# Patient Record
Sex: Male | Born: 1985 | Race: White | Hispanic: No | Marital: Married | Smoking: Former smoker
Health system: Southern US, Community
[De-identification: ages and names within clinical notes are randomized; demographics above are authoritative.]

---

## 2012-10-18 ENCOUNTER — Ambulatory Visit (INDEPENDENT_AMBULATORY_CARE_PROVIDER_SITE_OTHER): Payer: BC Managed Care – PPO | Admitting: Sports Medicine

## 2012-10-18 ENCOUNTER — Encounter: Payer: Self-pay | Admitting: *Deleted

## 2012-10-18 ENCOUNTER — Emergency Department (INDEPENDENT_AMBULATORY_CARE_PROVIDER_SITE_OTHER): Payer: BC Managed Care – PPO

## 2012-10-18 ENCOUNTER — Emergency Department
Admission: EM | Admit: 2012-10-18 | Discharge: 2012-10-18 | Disposition: A | Discharge status: Home / Self Care | Payer: BC Managed Care – PPO | Source: Home / Self Care | Attending: Family Medicine | Admitting: Family Medicine

## 2012-10-18 DIAGNOSIS — M7061 Trochanteric bursitis, right hip: Secondary | ICD-10-CM | POA: Insufficient documentation

## 2012-10-18 DIAGNOSIS — M25559 Pain in unspecified hip: Secondary | ICD-10-CM

## 2012-10-18 DIAGNOSIS — M7062 Trochanteric bursitis, left hip: Secondary | ICD-10-CM

## 2012-10-18 DIAGNOSIS — M76899 Other specified enthesopathies of unspecified lower limb, excluding foot: Secondary | ICD-10-CM

## 2012-10-18 MED ORDER — MELOXICAM 15 MG PO TABS: ORAL_TABLET | ORAL | Status: DC

## 2012-10-18 NOTE — ED Provider Notes (Signed)
History     CSN: [id number]  Arrival date & time 10/18/12  1154   First MD Initiated Contact with Patient 10/18/12 1221      Chief Complaint  Patient presents with  . Hip Pain       HPI Comments: Patient complains of onset of pain in his left hip today.  The pain is sharp and does not radiate.  He recalls no injury or recent change in activities.  Patient is a 27 y.o. male presenting with hip pain. The history is provided by the patient.  Hip Pain This is a new problem. Episode onset: today. The problem occurs constantly. The problem has not changed since onset.Associated symptoms comments: none. The symptoms are aggravated by walking. Nothing relieves the symptoms. He has tried nothing for the symptoms.    History reviewed. No pertinent past medical history.  History reviewed. No pertinent past surgical history.  History reviewed. No pertinent family history.  History  Substance Use Topics  . Smoking status: Current Every Day Smoker -- 0.50 packs/day    Types: Cigarettes  . Smokeless tobacco: Not on file  . Alcohol Use: Yes      Review of Systems  All other systems reviewed and are negative.    Allergies  Review of patient's allergies indicates no known allergies.  Home Medications   Current Outpatient Rx  Name  Route  Sig  Dispense  Refill  . meloxicam (MOBIC) 15 MG tablet      One tab PO qAM with breakfast for 2 weeks, then daily prn pain.   30 tablet   3     BP 113/78  Pulse 88  Temp(Src) 97.8 F (36.6 C) (Oral)  Resp 18  Ht 6\' 1"  (1.854 m)  Wt 203 lb (92.08 kg)  BMI 26.79 kg/m2  SpO2 98%  Physical Exam Nursing notes and Vital Signs reviewed. Appearance:  Patient appears healthy, stated age, and in no acute distress Eyes:  Pupils are equal, round, and reactive to light and accomodation.  Extraocular movement is intact.  Conjunctivae are not inflamed  Pharynx:  Normal Neck:  Supple.   No adenopathy Lungs:  Clear to auscultation.  Breath  sounds are equal.  Heart:  Regular rate and rhythm without murmurs, rubs, or gallops.  Abdomen:  Nontender without masses or hepatosplenomegaly.  Bowel sounds are present.  No CVA or flank tenderness.  Extremities:  No edema.  Left hip reveals distinct tenderness over the greater trochanter.  Palpating the greater trochanter during resisted lateral abduction of the hip recreates his pain.  Skin:  No rash present.  Back:  Full range of motion.  No tenderness.  Straight leg raising test is negative.  Sitting knee extension test is negative.  Strength and sensation in the lower extremities is normal.  Patellar and achilles reflexes are normal   ED Course  Procedures  none  Labs Reviewed    DG Hip Complete Left (Final result)  Result time: 10/18/12 12:59:56    Final result by Rad Results In Interface (10/18/12 12:59:56)    Narrative:   *RADIOLOGY REPORT*  Clinical Data: Recent hip pain without injury  LEFT HIP - COMPLETE 2+ VIEW  Comparison: None.  Findings: No acute fracture or dislocation is identified. No soft tissue abnormality is seen. The pelvic ring is intact.  IMPRESSION: No acute abnormality noted.   Original Report Authenticated By: Alcide Clever, M.D.       1. Trochanteric bursitis, left  MDM  Will arrange consultation with Dr. Rodney Langton for management and followup        Lattie Haw, MD 10/23/12 [phone]

## 2012-10-18 NOTE — Patient Instructions (Addendum)
Hip Rehabilitation Protocol:  1.  Side leg raises.  3x30 with no weight, then 3x15 with 2 lb ankle weight, then 3x15 with 5 lb ankle weight 2.  Standing hip rotation.  3x30 with no weight, then 3x15 with 2 lb ankle weight, then 3x15 with 5 lb ankle weight. 3.  Side step ups.  3x30 with no weight, then 3x15 with 5 lbs in backpack, then 3x15 with 10 lbs in backpack. 

## 2012-10-18 NOTE — ED Notes (Signed)
Pt c/o LT hip pain x 1 day. Denies injury. No OTC meds.

## 2012-10-18 NOTE — Assessment & Plan Note (Signed)
Mild on the right, moderate on the left. Severely weak hip abductors bilaterally. Mobic, home exercises. Return in 4 weeks, if no better we can consider injection.

## 2012-10-18 NOTE — Progress Notes (Signed)
   Subjective:    I'm seeing this patient as a consultation for:  Dr. Cathren Harsh  CC: Left hip pain  HPI: This is a very pleasant 27 year old male who comes in with a long history of right hip pain, and a several day history of pain he localizes over the greater trochanter of his left hip. He denies any changes in physical activity or trauma, but noticed pain that he localizes over the trochanter that wakes him up at night and makes it difficult for him to sleep on the lateral side. Pain does not radiate, moderate to severe. He has not tried any medication yet for this. He denies any pain running further down his leg, bowel or bladder changes, or pain in his groin.  Past medical history, Surgical history, Family history not pertinant except as noted below, Social history, Allergies, and medications have been entered into the medical record, reviewed, and no changes needed.   Review of Systems: No headache, visual changes, nausea, vomiting, diarrhea, constipation, dizziness, abdominal pain, skin rash, fevers, chills, night sweats, weight loss, swollen lymph nodes, body aches, joint swelling, muscle aches, chest pain, shortness of breath, mood changes, visual or auditory hallucinations.   Objective:   General: Well Developed, well nourished, and in no acute distress.  Neuro/Psych: Alert and oriented x3, extra-ocular muscles intact, able to move all 4 extremities, sensation grossly intact. Skin: Warm and dry, no rashes noted.  Respiratory: Not using accessory muscles, speaking in full sentences, trachea midline.  Cardiovascular: Pulses palpable, no extremity edema. Abdomen: Does not appear distended. Right Hip: ROM IR: 45 Deg, ER: 45 Deg, Flexion: 120 Deg, Extension: 100 Deg, Abduction: 45 Deg, Adduction: 45 Deg Strength IR: 5/5, ER: 5/5, Flexion: 5/5, Extension: 5/5, Abduction: 5/5, Adduction: 5/5 Pelvic alignment unremarkable to inspection and palpation. Tenderness to palpation of the greater  trochanter. No tenderness over piriformis and greater trochanter. No pain with FABER or FADIR. No SI joint tenderness and normal minimal SI movement.  Left Hip: ROM IR: 45 Deg, ER: 45 Deg, Flexion: 120 Deg, Extension: 100 Deg, Abduction: 45 Deg, Adduction: 45 Deg Strength IR: 5/5, ER: 5/5, Flexion: 5/5, Extension: 5/5, Abduction: 5/5, Adduction: 5/5 Pelvic alignment unremarkable to inspection and palpation. Tenderness to palpation of the greater trochanter. No tenderness over piriformis and greater trochanter. No pain with FABER or FADIR. No SI joint tenderness and normal minimal SI movement.   Hip abductor strength is surprisingly weak bilaterally.  Impression and Recommendations:   This case required medical decision making of moderate complexity.

## 2013-03-24 ENCOUNTER — Emergency Department
Admission: EM | Admit: 2013-03-24 | Discharge: 2013-03-24 | Disposition: A | Discharge status: Home / Self Care | Payer: BC Managed Care – PPO | Source: Home / Self Care | Attending: Emergency Medicine | Admitting: Emergency Medicine

## 2013-03-24 ENCOUNTER — Emergency Department (INDEPENDENT_AMBULATORY_CARE_PROVIDER_SITE_OTHER): Payer: BC Managed Care – PPO

## 2013-03-24 ENCOUNTER — Encounter: Payer: Self-pay | Admitting: Emergency Medicine

## 2013-03-24 DIAGNOSIS — M25449 Effusion, unspecified hand: Secondary | ICD-10-CM

## 2013-03-24 DIAGNOSIS — S60229A Contusion of unspecified hand, initial encounter: Secondary | ICD-10-CM

## 2013-03-24 DIAGNOSIS — S60221A Contusion of right hand, initial encounter: Secondary | ICD-10-CM

## 2013-03-24 MED ORDER — IBUPROFEN 200 MG PO TABS: ORAL_TABLET | ORAL | Status: AC

## 2013-03-24 MED ORDER — HYDROCODONE-ACETAMINOPHEN 5-325 MG PO TABS: 1.0000 | ORAL_TABLET | Freq: Four times a day (QID) | ORAL | Status: DC | PRN

## 2013-03-24 NOTE — ED Provider Notes (Signed)
CSN: [id number]     Arrival date & time 03/24/13  0930 History   First MD Initiated Contact with Patient 03/24/13 1000     Chief Complaint  Patient presents with  . Hand Injury   (Consider location/radiation/quality/duration/timing/severity/associated sxs/prior Treatment) Patient is a 27 y.o. male presenting with hand injury. The history is provided by the patient.  Hand Injury Location:  Hand Time since incident:  1 day Injury: yes (Accidentally closed right hand in his car door)   Mechanism of injury: crush   Crush injury:    Mechanism: Car door. Hand location:  R hand Pain details:    Quality:  Sharp   Radiates to:  R arm   Severity:  Severe   Onset quality:  Sudden   Progression:  Unchanged Handedness:  Right-handed Dislocation: no   Foreign body present:  No foreign bodies Prior injury to area:  No Worsened by:  Movement Ineffective treatments:  NSAIDs Associated symptoms: decreased range of motion, stiffness and swelling   Associated symptoms: no back pain, no fatigue, no fever, no muscle weakness, no neck pain, no numbness and no tingling   Risk factors: no known bone disorder, no frequent fractures and no recent illness     History reviewed. No pertinent past medical history. History reviewed. No pertinent past surgical history. History reviewed. No pertinent family history. History  Substance Use Topics  . Smoking status: Former Smoker -- 0.50 packs/day    Types: Cigarettes  . Smokeless tobacco: Never Used  . Alcohol Use: Yes    Review of Systems  Constitutional: Negative for fever and fatigue.  Musculoskeletal: Positive for joint swelling (Acutely, right-hand) and stiffness. Negative for back pain and neck pain.  All other systems reviewed and are negative.    Allergies  Review of patient's allergies indicates no known allergies.  Home Medications   Current Outpatient Rx  Name  Route  Sig  Dispense  Refill  . HYDROcodone-acetaminophen  (NORCO/VICODIN) 5-325 MG per tablet   Oral   Take 1-2 tablets by mouth every 6 (six) hours as needed for pain. Take with food.   10 tablet   0   . ibuprofen (ADVIL,MOTRIN) 200 MG tablet      Take three tablets ( 600 milligrams total) every 6 with food as needed for pain.   30 tablet   0    BP 117/78  Pulse 72  Resp 14  Ht 6\' 1"  (1.854 m)  Wt 243 lb (110.224 kg)  BMI 32.07 kg/m2  SpO2 100% Physical Exam  Nursing note and vitals reviewed. Constitutional: He is oriented to person, place, and time. He appears well-developed and well-nourished. No distress.  HENT:  Head: Normocephalic and atraumatic.  Eyes: Conjunctivae and EOM are normal. Pupils are equal, round, and reactive to light. No scleral icterus.  Neck: Normal range of motion.  Cardiovascular: Normal rate.   Pulmonary/Chest: Effort normal.  Abdominal: He exhibits no distension.  Neurological: He is alert and oriented to person, place, and time.  Skin: Skin is warm.  Psychiatric: He has a normal mood and affect.    Musculoskeletal: Baseline Strength and Sensation to R hand with normal light touch, intact for Pt, distal motor and sensation in median/radial/ulnar nerve distribution with CR< 2 secs and pulse intact. R  Hand: Very swollen, exquisitely tender, mildly ecchymotic dorsum right hand over second third and fourth metacarpals.   intact motor strength 5/5 flexion / extension / FDP (involved non-thumb digits) / FDS (involved non-thumb digits)  against resistance and 2-point discrimination intact at 5mm in affected digit(s). Skin intact.  Right Wrist: nontender, without deformity. WNL. Normal range of motion. Minimal muscle tenderness right forearm and right upper arm, but otherwise right upper extremity exam normal. No other bony tenderness. Right shoulder exam normal.  ED Course  Procedures (including critical care time) Labs Review Labs Reviewed - No data to display Imaging Review Dg Hand Complete  Right  03/24/2013   CLINICAL DATA:  Pain and swelling post injury 3rd and 4th metacarpal  EXAM: RIGHT HAND - COMPLETE 3+ VIEW  COMPARISON:  None.  FINDINGS: There is no evidence of fracture or dislocation. There is no evidence of arthropathy or other focal bone abnormality. Soft tissue swelling dorsal metacarpal region.  IMPRESSION: No acute fracture or subluxation. Soft tissue swelling metacarpal region.   Electronically Signed   By: Natasha Mead M.D.   On: 03/24/2013 10:17    EKG Interpretation     Ventricular Rate:    PR Interval:    QRS Duration:   QT Interval:    QTC Calculation:   R Axis:     Text Interpretation:              MDM   1. Contusion, hand, right, initial encounter    Reviewed x-ray, within normal limits. Other than soft tissue swelling dorsal metacarpal area. No acute fracture or subluxation. Discussed treatment options, and patient agrees with the following plans: Bulky 6 inch Ace applied right hand in neutral position. Keep elevated. Ice times another 24 hours. Ibuprofen 600 mg 3 times a day p.c. when necessary mild to moderate pain Vicodin. #10. No refills. One or 2Q4 to 6 hours when necessary severe pain. Instruction sheet given regarding hand contusions  Wrote a note, may not work until 03/27/13.--When returns to work on 03/27/13, restrictions of no heavy lifting or gripping with the right hand for 3 days, then otherwise may return to regular work without restrictions on 03/30/13. Explained that if he's not better in one week, followup with PCP or orthopedist. Precautions discussed. Red flags discussed. Questions invited and answered. Patient voiced understanding and agreement.    Lajean Manes, MD 03/24/13 1047

## 2013-03-24 NOTE — ED Notes (Signed)
Lee Schneider closed his right hand in his Lee Schneider door yesterday. Hand is edematous and painful to make a fist. Today pain is traveling up arm.

## 2014-10-26 ENCOUNTER — Encounter: Payer: Self-pay | Admitting: Emergency Medicine

## 2014-10-26 ENCOUNTER — Emergency Department (INDEPENDENT_AMBULATORY_CARE_PROVIDER_SITE_OTHER)
Admission: EM | Admit: 2014-10-26 | Discharge: 2014-10-26 | Disposition: A | Discharge status: Home / Self Care | Payer: BLUE CROSS/BLUE SHIELD | Source: Home / Self Care | Attending: Emergency Medicine | Admitting: Emergency Medicine

## 2014-10-26 ENCOUNTER — Emergency Department (INDEPENDENT_AMBULATORY_CARE_PROVIDER_SITE_OTHER): Payer: BLUE CROSS/BLUE SHIELD

## 2014-10-26 DIAGNOSIS — M25571 Pain in right ankle and joints of right foot: Secondary | ICD-10-CM | POA: Diagnosis not present

## 2014-10-26 DIAGNOSIS — S9031XA Contusion of right foot, initial encounter: Secondary | ICD-10-CM | POA: Diagnosis not present

## 2014-10-26 MED ORDER — HYDROCODONE-ACETAMINOPHEN 5-325 MG PO TABS: 1.0000 | ORAL_TABLET | Freq: Four times a day (QID) | ORAL | Status: AC | PRN

## 2014-10-26 NOTE — Discharge Instructions (Signed)
Contusion °A contusion is a deep bruise. Contusions are the result of an injury that caused bleeding under the skin. The contusion may turn blue, purple, or yellow. Minor injuries will give you a painless contusion, but more severe contusions may stay painful and swollen for a few weeks.  °CAUSES  °A contusion is usually caused by a blow, trauma, or direct force to an area of the body. °SYMPTOMS  °· Swelling and redness of the injured area. °· Bruising of the injured area. °· Tenderness and soreness of the injured area. °· Pain. °DIAGNOSIS  °The diagnosis can be made by taking a history and physical exam. An X-ray, CT scan, or MRI may be needed to determine if there were any associated injuries, such as fractures. °TREATMENT  °Specific treatment will depend on what area of the body was injured. In general, the best treatment for a contusion is resting, icing, elevating, and applying cold compresses to the injured area. Over-the-counter medicines may also be recommended for pain control. Ask your caregiver what the best treatment is for your contusion. °HOME CARE INSTRUCTIONS  °· Put ice on the injured area. °¨ Put ice in a plastic bag. °¨ Place a towel between your skin and the bag. °¨ Leave the ice on for 15-20 minutes, 3-4 times a day, or as directed by your health care provider. °· Only take over-the-counter or prescription medicines for pain, discomfort, or fever as directed by your caregiver. Your caregiver may recommend avoiding anti-inflammatory medicines (aspirin, ibuprofen, and naproxen) for 48 hours because these medicines may increase bruising. °· Rest the injured area. °· If possible, elevate the injured area to reduce swelling. °SEEK IMMEDIATE MEDICAL CARE IF:  °· You have increased bruising or swelling. °· You have pain that is getting worse. °· Your swelling or pain is not relieved with medicines. °MAKE SURE YOU:  °· Understand these instructions. °· Will watch your condition. °· Will get help right  away if you are not doing well or get worse. °Document Released: 03/11/2005 Document Revised: 06/06/2013 Document Reviewed: 04/06/2011 °[employer]® Patient Information ©2015 [employer]. This information is not intended to replace advice given to you by your health care provider. Make sure you discuss any questions you have with your health care provider. ° °

## 2014-10-26 NOTE — ED Provider Notes (Signed)
CSN: [id number]     Arrival date & time 10/26/14  [id number] History   First MD Initiated Contact with Patient 10/26/14 1913     Chief Complaint  Patient presents with  . Foot Injury   (Consider location/radiation/quality/duration/timing/severity/associated sxs/prior Treatment) Patient is a 29 y.o. male presenting with foot injury. The history is provided by the patient. No language interpreter was used.  Foot Injury Location:  Foot Time since incident:  1 day Injury: yes   Foot location:  R foot Pain details:    Quality:  Aching   Radiates to:  Does not radiate   Severity:  Moderate   Onset quality:  Gradual   Duration:  1 hour   Timing:  Constant   Progression:  Worsening Chronicity:  New Relieved by:  Nothing Worsened by:  Nothing tried Ineffective treatments:  None tried Pt dropped a trailer hitch on his foot.  History reviewed. No pertinent past medical history. History reviewed. No pertinent past surgical history. History reviewed. No pertinent family history. History  Substance Use Topics  . Smoking status: Former Smoker -- 0.50 packs/day    Types: Cigarettes  . Smokeless tobacco: Never Used  . Alcohol Use: Yes    Review of Systems  Musculoskeletal: Positive for myalgias and joint swelling.  All other systems reviewed and are negative.   Allergies  Review of patient's allergies indicates no known allergies.  Home Medications   Prior to Admission medications   Medication Sig Start Date End Date Taking? Authorizing Provider  HYDROcodone-acetaminophen (NORCO/VICODIN) 5-325 MG per tablet Take 1-2 tablets by mouth every 6 (six) hours as needed for pain. Take with food. 03/24/13   Lajean Manesavid Massey, MD  ibuprofen (ADVIL,MOTRIN) 200 MG tablet Take three tablets ( 600 milligrams total) every 6 with food as needed for pain. 03/24/13   Lajean Manesavid Massey, MD   BP 104/64 mmHg  Pulse 79  Temp(Src) 97.7 F (36.5 C) (Oral)  Resp 16  Ht 6\' 1"  (1.854 m)  Wt 218 lb (98.884 kg)  BMI  28.77 kg/m2  SpO2 97% Physical Exam  Constitutional: He is oriented to person, place, and time. He appears well-developed and well-nourished.  Musculoskeletal: He exhibits tenderness.  Swollen bruised right 5th toe, tender mid foot,  From, nv and ns intact  Neurological: He is alert and oriented to person, place, and time. He has normal reflexes.  Psychiatric: He has a normal mood and affect.    ED Course  Procedures (including critical care time) Labs Review Labs Reviewed - No data to display  Imaging Review No results found.   MDM   1. Contusion, foot, right, initial encounter    Ace wrap Post op shoe Ibuprofen otc Follow up with Dr. Karie Schwalbe if pain persist. Pt given rx for hydrocodone 10 tablets  ( pt counseled on abuse potential)   Elson AreasLeslie K Akeila Lana, PA-C 10/26/14 1941  Lonia SkinnerLeslie K KimballSofia, PA-C 10/26/14 1955

## 2014-10-26 NOTE — ED Notes (Signed)
Reports dropping a trailer tongue/hitch on his right foot about one hour ago; very painful and difficult to bear weight; no broken skin.

## 2016-12-22 IMAGING — CR DG FOOT COMPLETE 3+V*R*
3 series · 3 of 3 positions shown · non-contrast
Comparison: None.

CLINICAL DATA: Injury to the right foot today. The patient
complains of pain in the lateral foot.

EXAM:
RIGHT FOOT COMPLETE - 3+ VIEW

[foot ap]
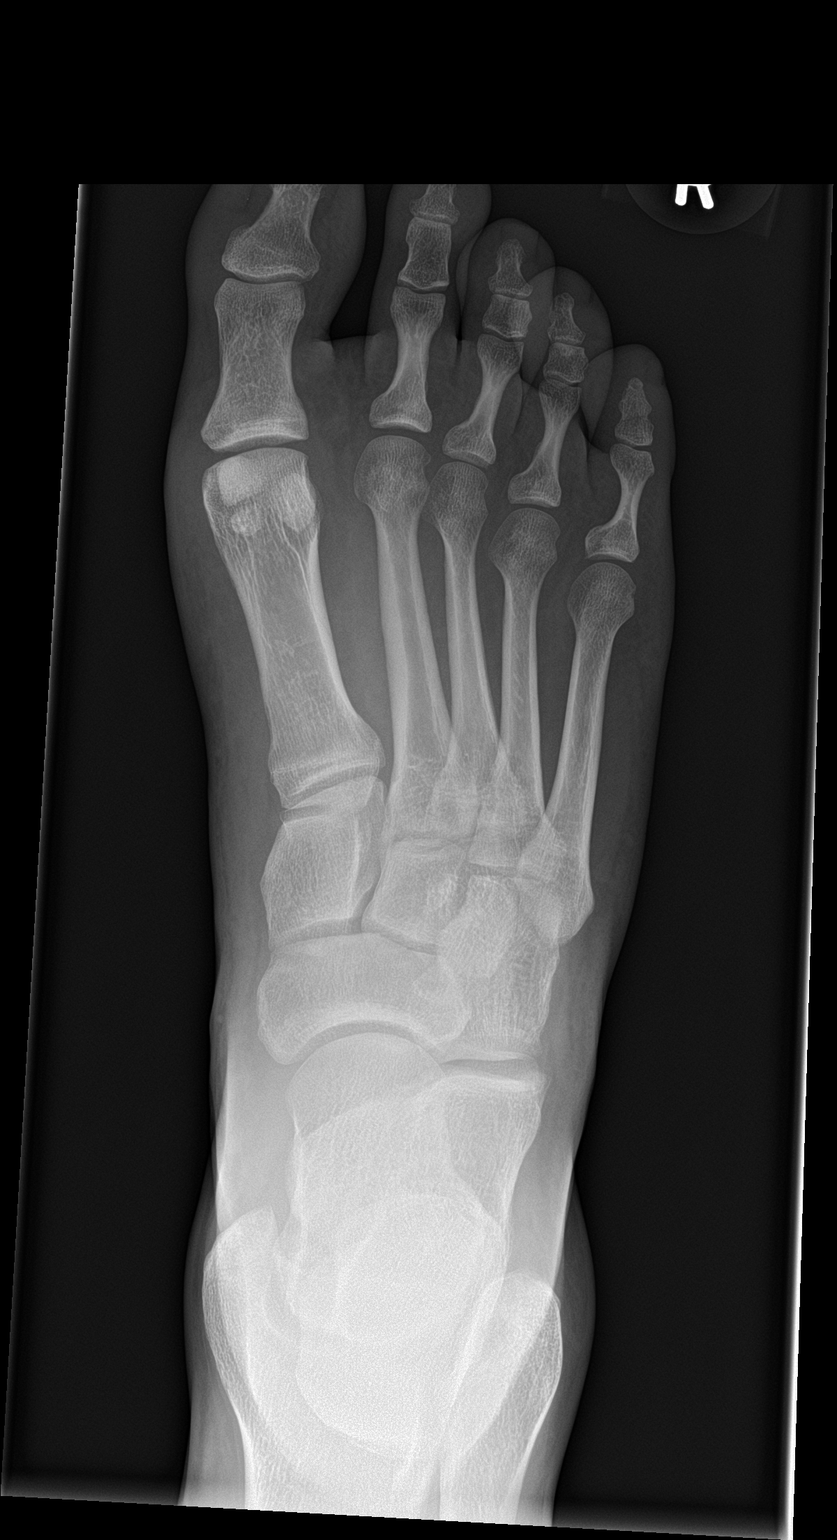

[foot obl]
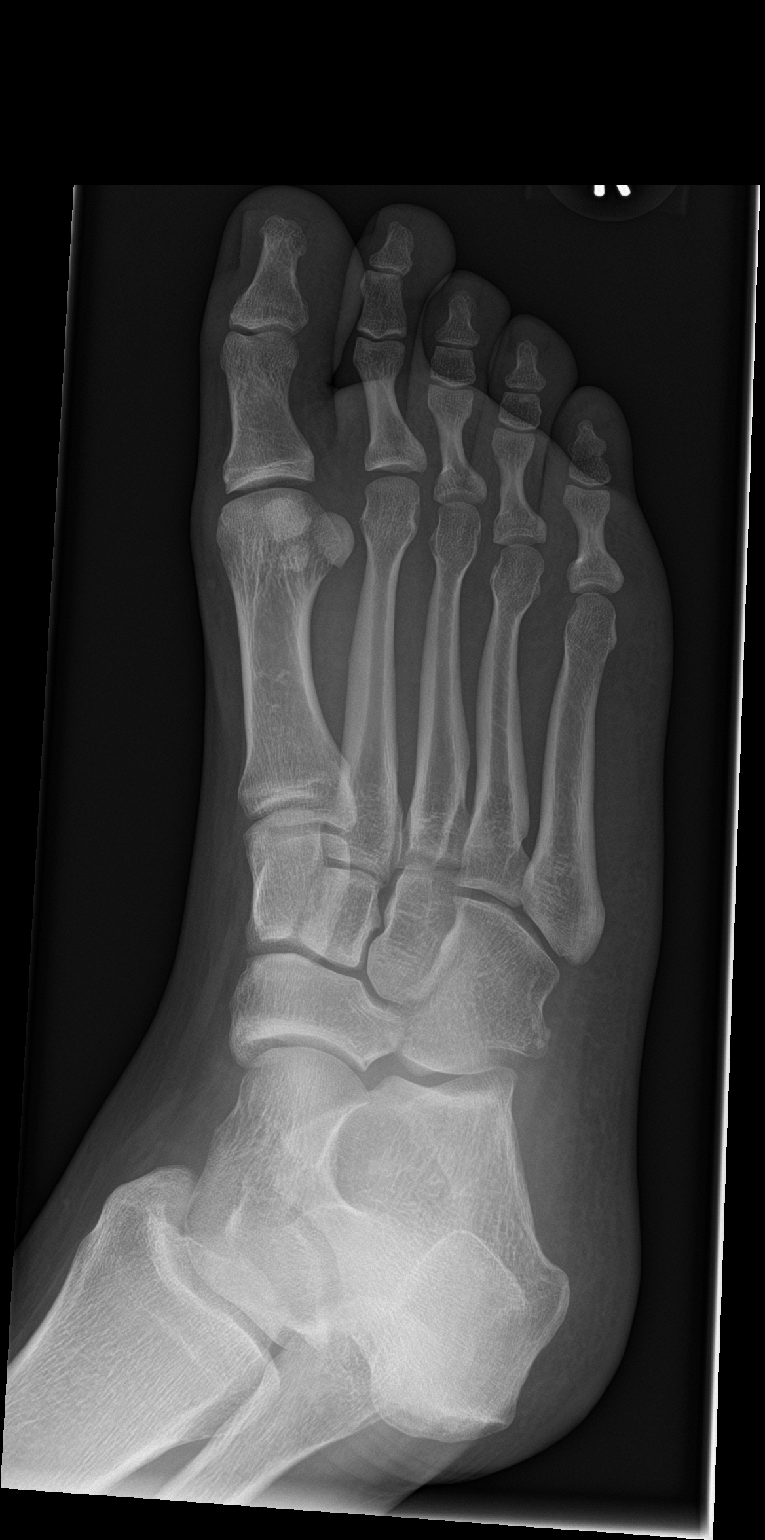

[foot lat]
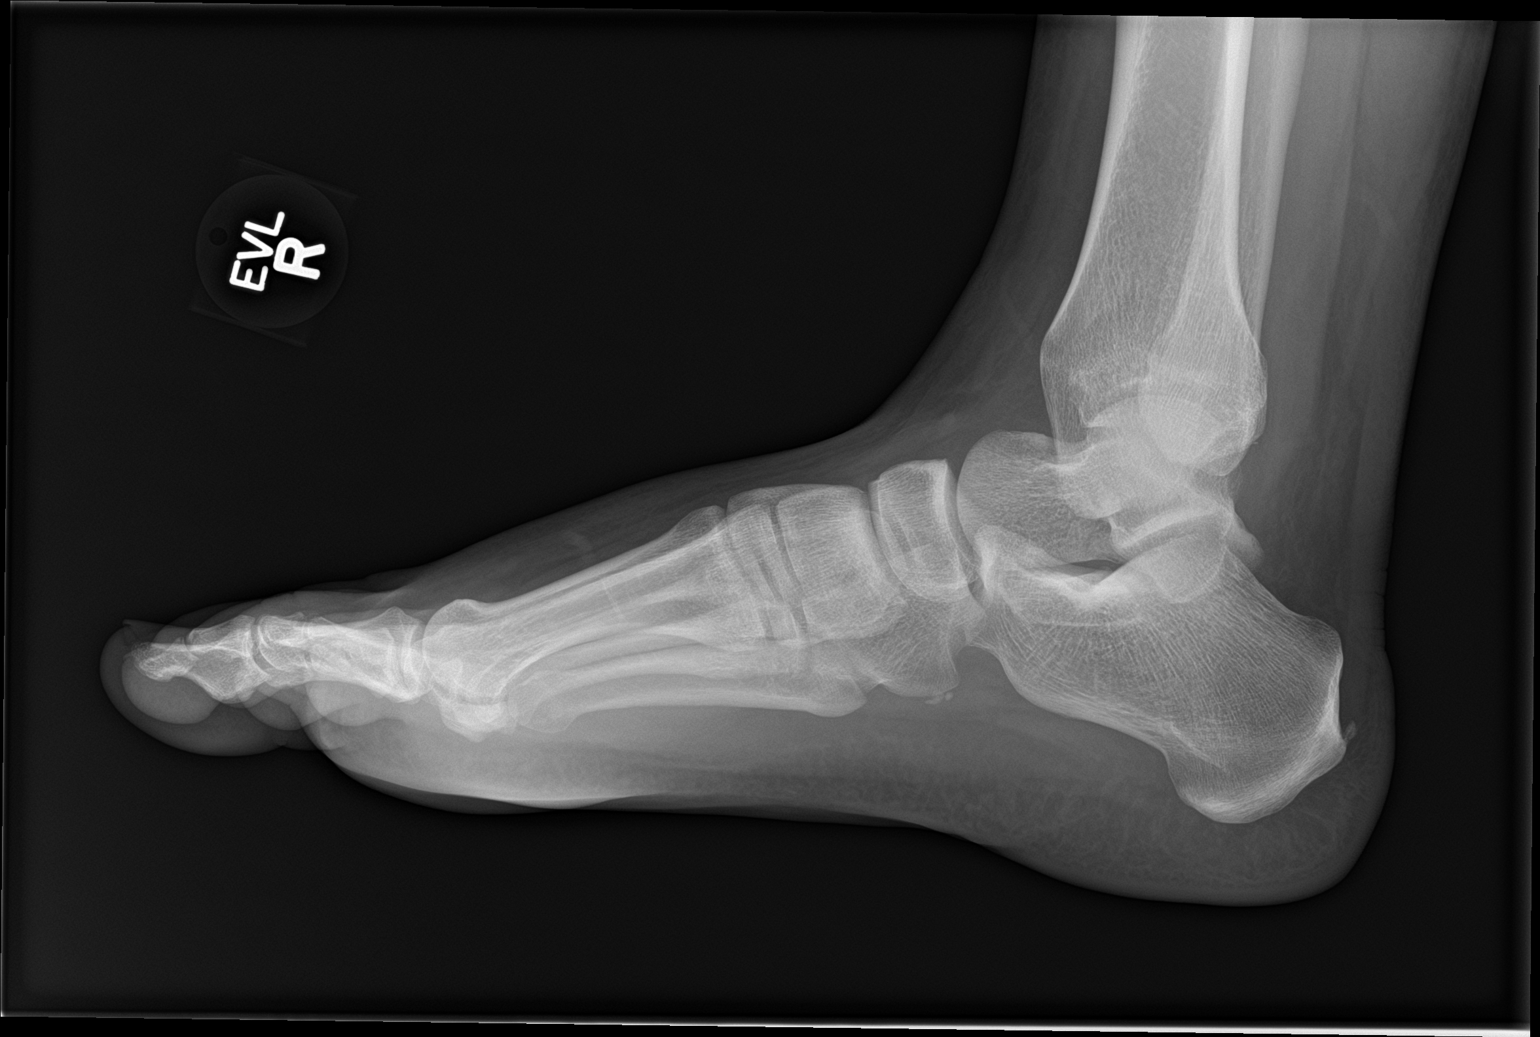

[3 of 3 positions shown; findings below may reference images not displayed]

FINDINGS: There is no evidence of fracture or dislocation. There is no
evidence of arthropathy or other focal bone abnormality. Soft
tissues are unremarkable.
IMPRESSION: Negative.
